# Patient Record
Sex: Male | Born: 1945 | Race: White | Hispanic: No | Marital: Married
Health system: Southern US, Community
[De-identification: ages and names within clinical notes are randomized; demographics above are authoritative.]

---

## 2011-06-10 LAB — CBC
HCT: 39.2 % — ABNORMAL LOW (ref 40.0–52.0)
HGB: 13.2 g/dL (ref 13.0–18.0)
MCH: 29.8 pg (ref 26.0–34.0)
MCHC: 33.8 g/dL (ref 32.0–36.0)
MCV: 88 fL (ref 80–100)
RDW: 14.2 % (ref 11.5–14.5)
WBC: 5.4 10*3/uL (ref 3.8–10.6)

## 2011-06-10 LAB — ETHANOL: Ethanol: <3 mg/dL

## 2011-06-10 LAB — COMPREHENSIVE METABOLIC PANEL
Albumin: 3.5 g/dL (ref 3.4–5.0)
Alkaline Phosphatase: 115 U/L (ref 50–136)
Anion Gap: 10 (ref 7–16)
Bilirubin,Total: 0.5 mg/dL (ref 0.2–1.0)
Chloride: 103 mmol/L (ref 98–107)
Co2: 27 mmol/L (ref 21–32)
Creatinine: 0.89 mg/dL (ref 0.60–1.30)
EGFR (African American): >60
Glucose: 97 mg/dL (ref 65–99)
Osmolality: 281 (ref 275–301)
Potassium: 3.4 mmol/L — ABNORMAL LOW (ref 3.5–5.1)
SGPT (ALT): 22 U/L
Sodium: 140 mmol/L (ref 136–145)

## 2011-06-10 LAB — VALPROIC ACID LEVEL: Valproic Acid: <3 ug/mL — ABNORMAL LOW

## 2011-06-10 LAB — SALICYLATE LEVEL: Salicylates, Serum: <1.7 mg/dL

## 2011-06-10 LAB — ACETAMINOPHEN LEVEL: Acetaminophen: <2 ug/mL

## 2011-06-10 LAB — TSH: Thyroid Stimulating Horm: 1.73 u[IU]/mL

## 2011-06-11 ENCOUNTER — Inpatient Hospital Stay: Payer: Self-pay | Admitting: Unknown Physician Specialty

## 2011-06-11 DIAGNOSIS — R9431 Abnormal electrocardiogram [ECG] [EKG]: Secondary | ICD-10-CM

## 2011-06-11 LAB — DRUG SCREEN, URINE
Amphetamines, Ur Screen: NEGATIVE (ref ?–1000)
Barbiturates, Ur Screen: NEGATIVE (ref ?–200)
Cocaine Metabolite,Ur ~~LOC~~: NEGATIVE (ref ?–300)
MDMA (Ecstasy)Ur Screen: NEGATIVE (ref ?–500)
Opiate, Ur Screen: NEGATIVE (ref ?–300)
Phencyclidine (PCP) Ur S: NEGATIVE (ref ?–25)

## 2011-06-12 LAB — PHOSPHORUS: Phosphorus: 3 mg/dL (ref 2.5–4.9)

## 2011-06-12 LAB — MAGNESIUM: Magnesium: 1.9 mg/dL

## 2011-06-15 LAB — COMPREHENSIVE METABOLIC PANEL
Alkaline Phosphatase: 99 U/L (ref 50–136)
Anion Gap: 7 (ref 7–16)
BUN: 19 mg/dL — ABNORMAL HIGH (ref 7–18)
Calcium, Total: 8.9 mg/dL (ref 8.5–10.1)
EGFR (African American): >60
Osmolality: 289 (ref 275–301)
Potassium: 3.9 mmol/L (ref 3.5–5.1)
SGPT (ALT): 15 U/L
Sodium: 144 mmol/L (ref 136–145)
Total Protein: 6.3 g/dL — ABNORMAL LOW (ref 6.4–8.2)

## 2011-06-18 LAB — VALPROIC ACID LEVEL: Valproic Acid: 68 ug/mL

## 2012-10-19 DEATH — deceased

## 2013-08-23 IMAGING — CR DG CHEST 2V
1 series · 2 of 2 positions shown · non-contrast
Comparison: none

REASON FOR EXAM: required for [REDACTED] placement
COMMENTS:

PROCEDURE:     DXR - DXR CHEST PA (OR AP) AND LATERAL  - June 19, 2011 [DATE]
RESULT:     The lung fields are clear. No pneumonia, pneumothorax or pleural
effusion is seen. The heart size is normal. The chest is bilaterally
hyperinflated which suggests a history of COPD or asthma.

[Series 1: pa · 0.17mm/px · 2 of 2 slices shown]
[im 1/2]
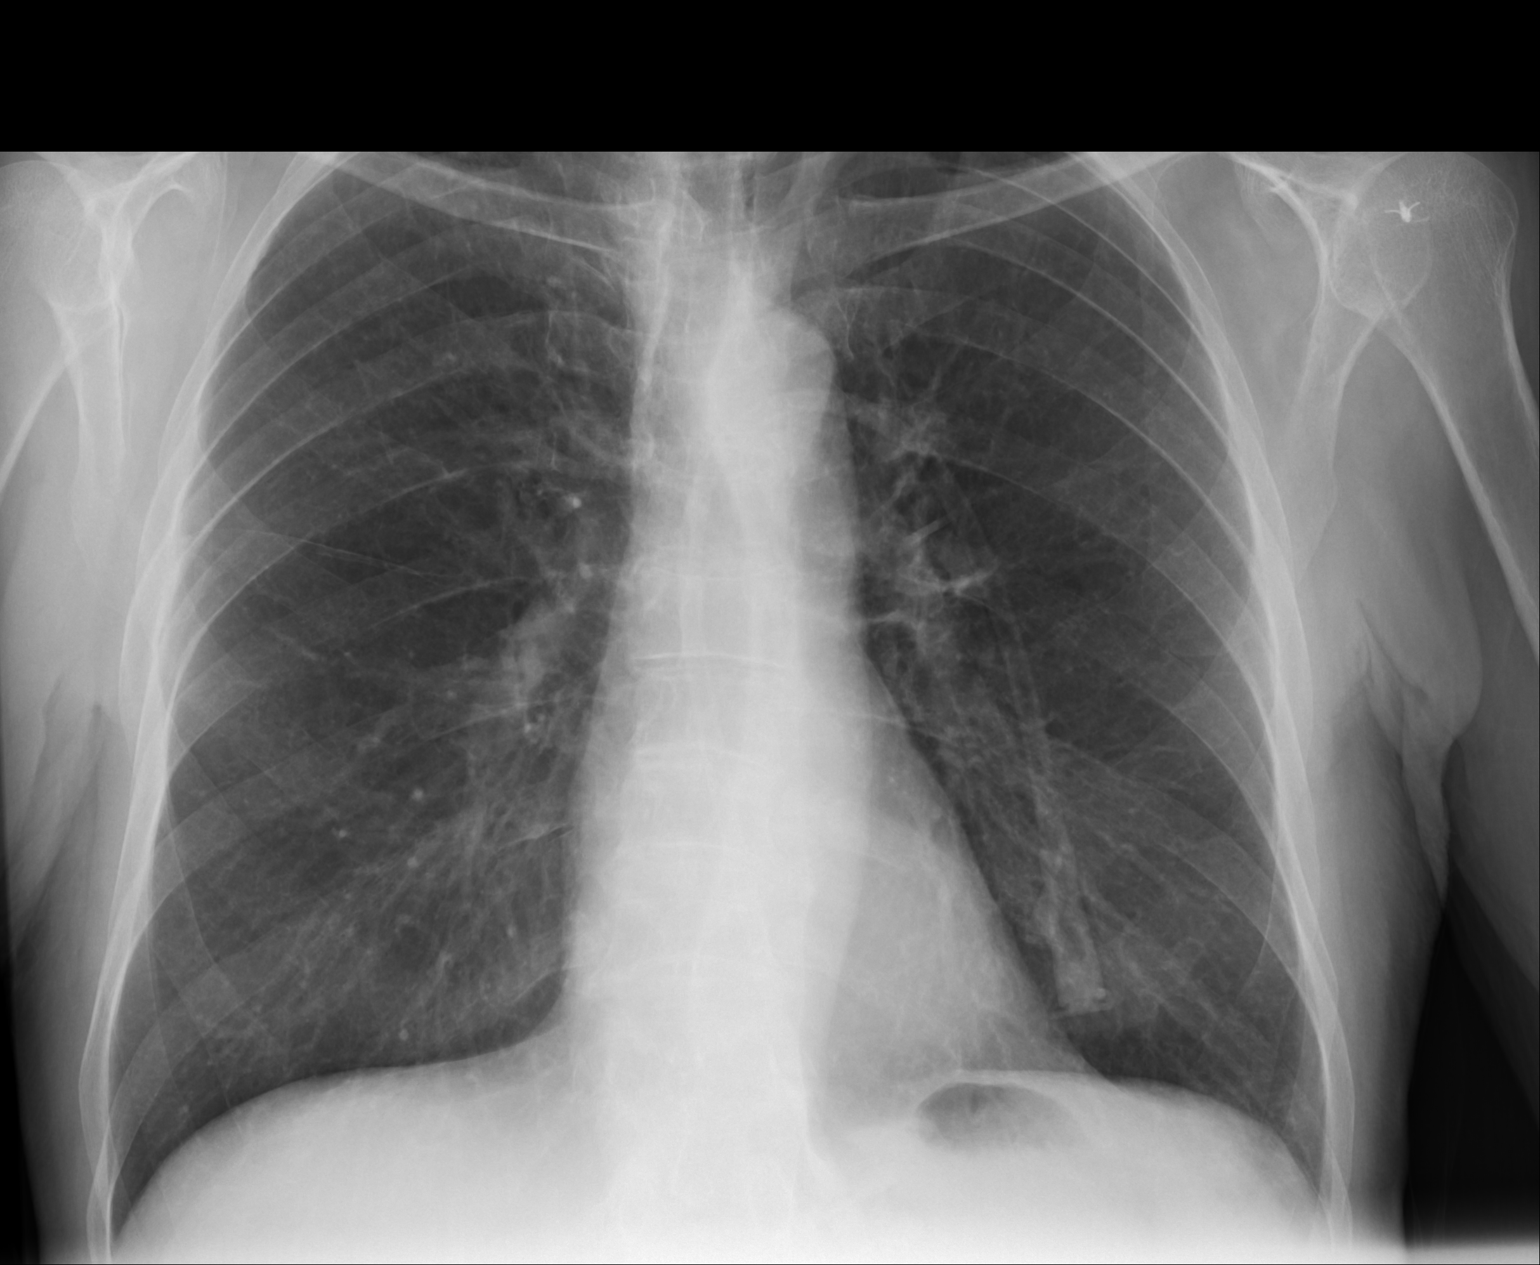
[im 2/2]
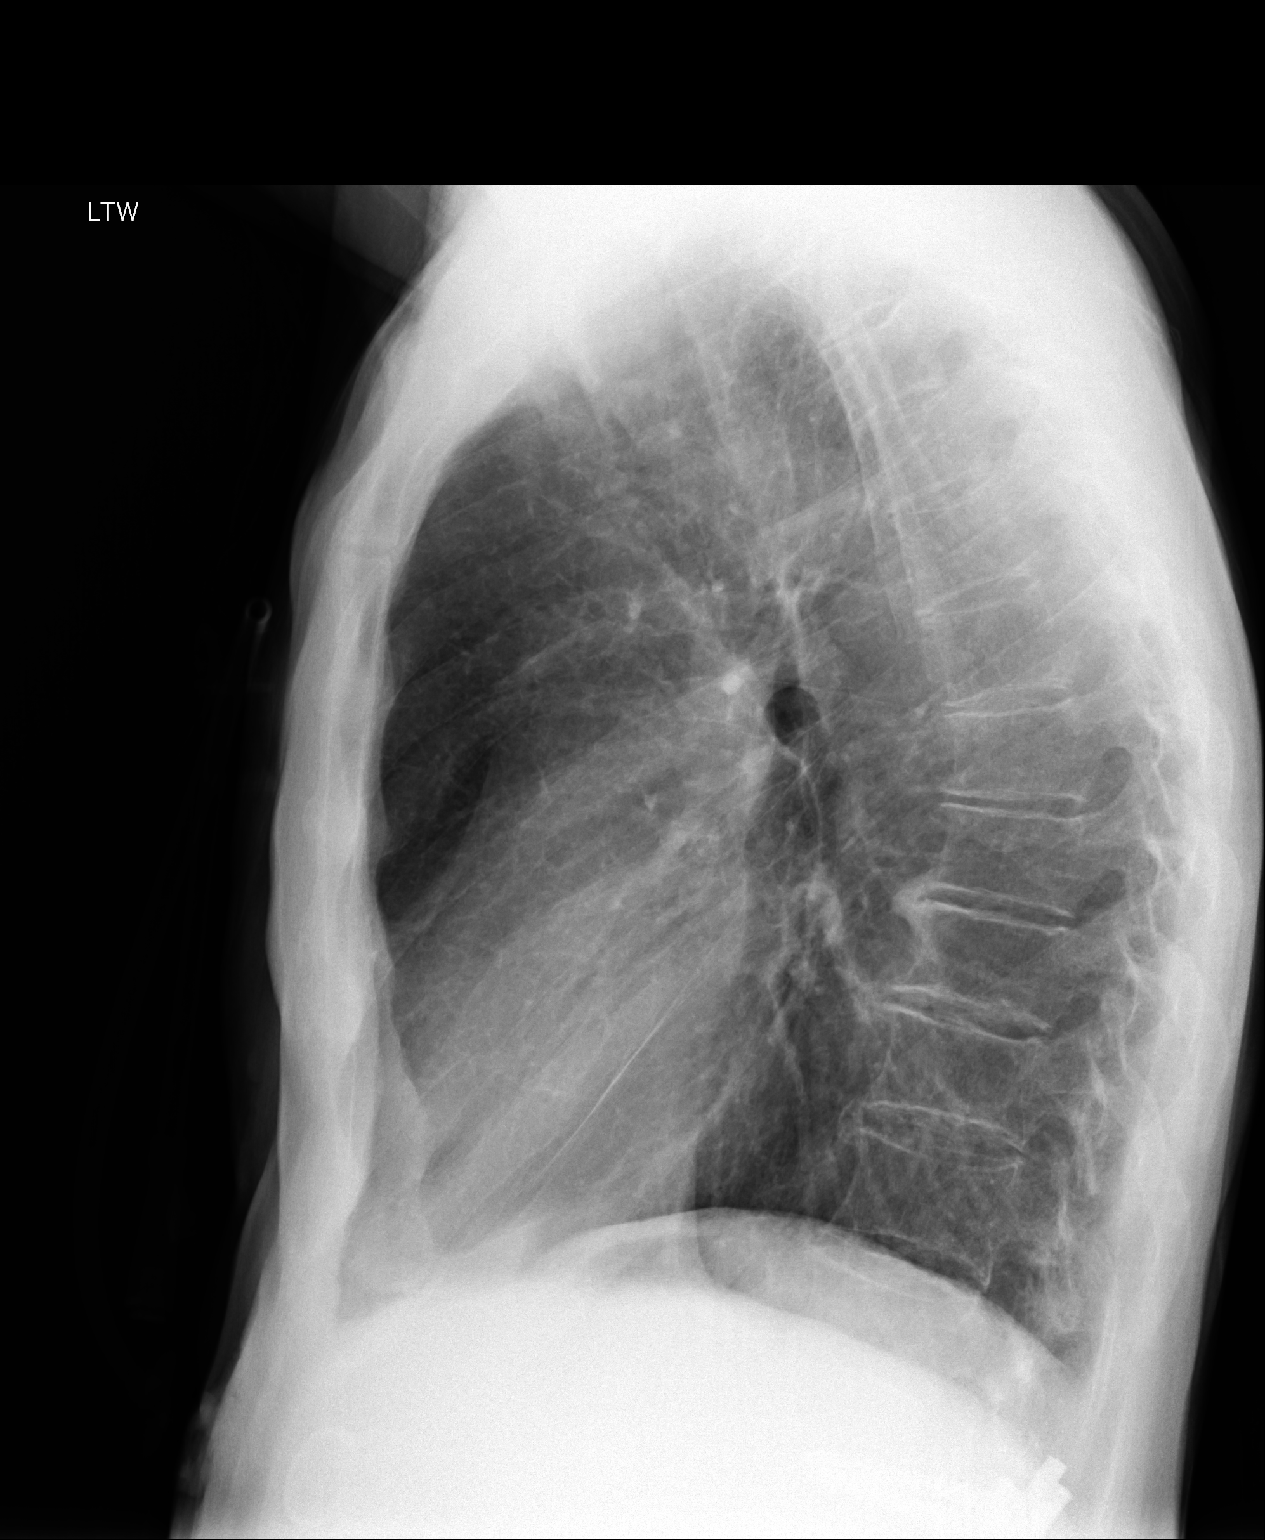

[2 of 2 positions shown; findings below may reference images not displayed]

IMPRESSION: 1.  The lung fields are clear.
2.  The heart size is normal.
3.  The chest appears mildly hyperinflated which suggests a history of COPD
or asthma.

[REDACTED]

## 2014-05-13 NOTE — Discharge Summary (Signed)
PATIENT NAME:  Martin Sanford, Martin Sanford MR#:  [id number] DATE OF BIRTH:  06-12-1945  DATE OF ADMISSION:  06/11/2011 DATE OF DISCHARGE:  06/22/2011   ADDENDUM:   FURTHER HOSPITAL COURSE: Discharge was cancelled on Friday, May 31st, primarily because the patient did not have funds to pay for the group home. In the interim over the weekend his brother found him a place to stay by himself that is close to where he lives and was also set up different appointments and somebody to come into the house to help him. However, he was obviously capable of giving himself his own medication. If you remember, his Mini-Mental Status was almost perfect. No suicidal thinking at the time of discharge and will [facility] and some agencies there.   ____________________________ Venida JarvisWilliam James Terriona Horlacher II, MD [id number] D: 06/22/2011 13:24:11 ET T: 06/22/2011 14:32:33 ET JOB#: [id number]  cc: Venida JarvisWilliam James Angi Goodell II, MD, <Dictator> Jules HusbandsWILLIAM J Dariella Gillihan MD ELECTRONICALLY SIGNED 06/26/2011 14:17
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
